# Patient Record
Sex: Female | Born: 2005 | Race: White | Hispanic: No | Marital: Single | Smoking: Never smoker
Health system: Southern US, Academic
[De-identification: ages and names within clinical notes are randomized; demographics above are authoritative.]

## PROBLEM LIST (undated history)

## (undated) DIAGNOSIS — M1990 Unspecified osteoarthritis, unspecified site: Secondary | ICD-10-CM

## (undated) DIAGNOSIS — Z993 Dependence on wheelchair: Secondary | ICD-10-CM

## (undated) DIAGNOSIS — Q068 Other specified congenital malformations of spinal cord: Secondary | ICD-10-CM

## (undated) DIAGNOSIS — Q760 Spina bifida occulta: Secondary | ICD-10-CM

## (undated) DIAGNOSIS — Q7649 Other congenital malformations of spine, not associated with scoliosis: Secondary | ICD-10-CM

## (undated) DIAGNOSIS — Z973 Presence of spectacles and contact lenses: Secondary | ICD-10-CM

## (undated) DIAGNOSIS — Z87440 Personal history of urinary (tract) infections: Secondary | ICD-10-CM

## (undated) DIAGNOSIS — N319 Neuromuscular dysfunction of bladder, unspecified: Secondary | ICD-10-CM

## (undated) DIAGNOSIS — K5900 Constipation, unspecified: Secondary | ICD-10-CM

## (undated) HISTORY — DX: Other specified congenital malformations of spinal cord: Q06.8

## (undated) HISTORY — PX: HX OTHER: 2100001105

## (undated) HISTORY — PX: TOE SURGERY: SHX1073

## (undated) HISTORY — PX: HX SPINAL CORD DECOMPRESSION: SHX97

## (undated) HISTORY — DX: Unspecified osteoarthritis, unspecified site: M19.90

## (undated) HISTORY — DX: Other congenital malformations of spine, not associated with scoliosis: Q76.49

---
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
## 2024-04-15 ENCOUNTER — Encounter (INDEPENDENT_AMBULATORY_CARE_PROVIDER_SITE_OTHER): Payer: Self-pay | Admitting: Urology

## 2024-04-15 NOTE — Nursing Note (Signed)
 Bartlett, Megan D, PA-C  Pammie Chirino, RN  We can schedule telephone visit with APP or AL-Omar          Previous Messages    Message from Fannie HERO sent at 04/15/2024  2:40 PM EDT    Summary: FW: Clinical Question    Copied From CRM [phone].  Warren (Parent) called with a clinical question.    [place] Pt    Pts mother is asking for a call back to discuss some questions that she has. Pts mother states that the pt is now on Medicare and they are requesting that office notes be sent for the past 12 months in order to help them cover the pts supplies.She states that the order for the pts supplies expires on 7.22.25.  They are needing to know if they are needing another appt as the pt has not been seen since 2024 so there are no records within the past 12 months.She would also like to know if this can be a telehealth visit as well. Please contact the pts mother about this    Thank you                Call History    Contact Date/Time Type Contact Phone/Fax By   04/15/2024 02:33 PM EDT Phone (Incoming) Warren (Parent) [phone] Chaney, [employer]     Attempted to return the call to the patient's mother.  There was no answer and a voice mail message was left requesting a return call.  The last clinic note from 04/27/23 was faxed to [facility] at [phone].    Zuri Lascala, RN

## 2024-04-19 ENCOUNTER — Encounter (INDEPENDENT_AMBULATORY_CARE_PROVIDER_SITE_OTHER): Payer: Self-pay | Admitting: Urology

## 2024-04-19 NOTE — Nursing Note (Signed)
 Message from [place] [facility] sent at 04/19/2024  4:11 PM EDT    Al-Omar    Rosaria from [employer] called because she received the office notes for this patient's incontinence supplies but it is dated outside of a year and the office notes need to be within a year's time. They will not send the supplies without it.  She is requesting a return call to advise when the patient's next appointment is and I told her that it seems to be made incorrectly as it is dated for a follow up of next year, even though the notes on the appointment says annual follow up.  The best callback number is listed and the queue will be sent to her since this is Dominique's case but said that any representative can assist.    Thank you!       Call History    Contact Date/Time Type Contact Phone/Fax By   04/19/2024 03:59 PM EDT Phone (Incoming) Rosaria [phone] [facility] Grate     Returned the call to [facility].  Spoke to [place].   She states that the patient needs annual visits for the insurance coverage of supplies. Last appointment was 04/27/23 and is not due for a follow up until 2026.  Metta will reach out to the patient's mother and have her call Urology back for an appointment or to discuss getting notes from the PCP.    Jany Buckwalter, RN

## 2024-04-20 ENCOUNTER — Encounter (INDEPENDENT_AMBULATORY_CARE_PROVIDER_SITE_OTHER): Payer: Self-pay | Admitting: Urology

## 2024-04-20 NOTE — Nursing Note (Signed)
 Attempted to contact the mother to follow up on catheter supplies.  There was no answer and a voice mail message was left requesting a return call.  Since the patient is not due to come back until July 2026, will offer a telephone visit with APP or may get notes from PCP.  Insurance needs annual chart notes for catheter coverage.    Rami Budhu, RN

## 2024-04-22 ENCOUNTER — Ambulatory Visit: Attending: Physician Assistant | Admitting: Physician Assistant

## 2024-04-22 DIAGNOSIS — N319 Neuromuscular dysfunction of bladder, unspecified: Secondary | ICD-10-CM

## 2024-04-22 MED ORDER — OXYBUTYNIN CHLORIDE ER 10 MG TABLET,EXTENDED RELEASE 24 HR
10.0000 mg | EXTENDED_RELEASE_TABLET | Freq: Every day | ORAL | 3 refills | Status: AC
Start: 2024-04-22 — End: 2025-04-22

## 2024-04-22 NOTE — Progress Notes (Signed)
 PEDIATRIC UROLOGY, PHYSICIAN OFFICE CENTER  [address]  [place] [address]  Operated by [facility]  Telephone Visit    Name:  Cynthia [name] MRN: [id number]   Date:  04/22/2024 DOB: November 04, 2005 (18 y.o.)          The patient/family initiated a request for telephone service.  Verbal consent for this service was obtained from the patient/family.  Reason for audio only:Patient preference    Last office visit in this department: 04/27/23      Reason for call: updated note for insurance reasons     Call notes: Cynthia [name] is 18 yo female with history of sacral agenesis and tethered cord. She is doing CIC every 3-4 hours with no daytime incontinence and rare nighttime incontinence. She continues to take ditropan  daily. No UTIs. No concerns.     Assessment:  Sacral agenesis  s/p tethered cord release  Neurogenic bladder, on CIC q3-4 hours and ditropan  10 mg XL  Neurogenic bowel, non-compliant with regimen    Plan:  Patient requires 6 straight catheters per day for urinary retention secondary to neurogenic bladder  Refilled ditropan  XL 10 mg daily  Follow up as scheduled with [facility] next year     No diagnosis found.    LOS Determination: Medical Decision Making- Direct audio communication with patient was 5 minutes    *This was an independent visit     Duwaine Sequin , PA-C

## 2024-04-25 ENCOUNTER — Encounter (INDEPENDENT_AMBULATORY_CARE_PROVIDER_SITE_OTHER): Payer: Self-pay | Admitting: Urology

## 2024-04-25 NOTE — Nursing Note (Signed)
 Clinic visit from 04/22/24 faxed to [facility] at [phone] for catheter supplies.    Ottis Vacha, RN

## 2024-05-02 ENCOUNTER — Ambulatory Visit (INDEPENDENT_AMBULATORY_CARE_PROVIDER_SITE_OTHER): Payer: Self-pay | Admitting: Urology

## 2024-05-24 ENCOUNTER — Ambulatory Visit (INDEPENDENT_AMBULATORY_CARE_PROVIDER_SITE_OTHER)

## 2024-07-15 ENCOUNTER — Ambulatory Visit: Payer: Self-pay | Attending: Nurse Practitioner | Admitting: Nurse Practitioner

## 2024-07-15 ENCOUNTER — Encounter (INDEPENDENT_AMBULATORY_CARE_PROVIDER_SITE_OTHER): Payer: Self-pay | Admitting: Nurse Practitioner

## 2024-07-15 ENCOUNTER — Other Ambulatory Visit: Payer: Self-pay

## 2024-07-15 VITALS — BP 112/52 | HR 85 | Temp 96.7°F | Ht <= 58 in | Wt 139.3 lb

## 2024-07-15 DIAGNOSIS — M545 Low back pain, unspecified: Secondary | ICD-10-CM | POA: Insufficient documentation

## 2024-07-15 DIAGNOSIS — Z981 Arthrodesis status: Secondary | ICD-10-CM | POA: Insufficient documentation

## 2024-07-15 NOTE — Progress Notes (Signed)
 NEUROSURGERY, PHYSICIAN OFFICE CENTER  [address]  [place] [address]  Operated by [facility]  New Patient Note    Name: Cynthia [name] MRN:  [id number]   Date: 07/15/2024 DOB:  [birth year] (18 y.o.)      Referring physician: Kennyth Grate, [facility]  [address]  [place],  [place] [address]    Gender: female  Last RTV on 03/06/2021 with Peds NSGY.    Chief Complaint:   Chief Complaint   Patient presents with    New Patient     History is provided by patient    History of Present Illness: some previously noted and relevant.   Cynthia [name] is a 18 y/o right handed female here today for evaluation lumbar spine. Initially reported: 18 yo F w a h/o sacral agenesis. She underwent tethered cord release at around the age of 15 months at [facility] in [place] . She is s/p tethered cord release by Dr. Andreas on 02/26/2021, in conjunction with an L2-pelvis spinal fusion by Dr. Glendon. She has a neurogenic bladder and follows with Urology and in the myelo clinic. She has followed with pain clinic in [facility] with recent injection that worsened her LBP. C/o pain in the low back only at this time.  She is requesting today to establish care with [facility] as well as recommendations for further treatment options nonsurgically.  Per last RTV on 03/06/2021 with Peds NSGY  Cynthia [name] is a female who recently s/p tethered cord release by Dr. Andreas on 02/26/2021 by Dr. Andreas, in conjunction with an L2-pelvis spinal fusion by Dr. Glendon. Cynthia [name] recently established care with neurosurgery on 12/13/20 for a history of sacral agenesis. She underwent tethered cord release at around the age of 228 months at [facility] in [place] .  She performs CIC around 8 times a day. She has declined a Mitrofanoff procedure in the past. She ambulates with AFOs but relies on wheelchair for longer distances outside of the the home. Her recent symptoms have included  progressive back and bilateral leg pain. Her imaging also showed a spondy at L5-S1, for which she was referred to orthopedics. She was felt to be an appropriate candidate for spinal fusion as well as release of tethered cord, and she underwent  tethered cord release by Dr. Andreas on 02/26/2021 by Dr. Andreas, in conjunction with an L2-pelvis spinal fusion by Dr. Glendon. She tolerated surgery well.     Today, the patient is doing fair. Pain is rated as a 0/10 however does report intermittent axial low back pain.  She at baseline straight caths urine. She also has to manually evacuate her bowels at times (Both chronic issues).     Past History  Current Medications[1]   Allergies[2]  Past Medical History:   Diagnosis Date    Arthrosis     Arthrosis of ankle 09/13/2013    Both ankles    Constipation     Lumbar pseudoarthrosis 04/01/2022    Neurogenic bladder     straight cath Q3-4 hr.    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    Tethered cord     Uses wheelchair     Vesicoureteral reflux 09/13/2013    Wears glasses      Past Surgical History:   Procedure Laterality Date    HX OTHER Bilateral  Club Foot surgery    HX OTHER  02/26/2021    L2-pelvis PSSI/PSF, allo & autograft & release of tethered cord    HX SPINAL CORD DECOMPRESSION  at age 228 months    Tethered cord release    TOE SURGERY Left      Family History  Family Medical History:       Problem Relation (Age of Onset)    Breast Cancer Maternal Grandmother    Diabetes Mother    Heart Attack Paternal Grandfather    Lung Cancer Paternal Grandmother    Melanoma Maternal Aunt, Maternal Grandmother, Paternal Grandmother    No Known Problems Father          Social History  Social History     Socioeconomic History    Marital status: Single   Occupational History    Occupation: Disabled   Tobacco Use    Smoking status: Never     Passive exposure: Never    Smokeless tobacco: Never    Vaping Use    Vaping status: Never Used   Substance and Sexual Activity    Alcohol use: Never    Drug use: Never   Other Topics Concern    Ability to Walk 1 Flight of Steps without SOB/CP No    Routine Exercise No    Ability to Walk 2 Flight of Steps without SOB/CP No    Ability To Do Own ADL's Yes    Other Activity Level Yes     Comment: uses wheelchair   Social History Narrative    Denies illicit drug use.      Review of Systems  Other than ROS in the HPI, all other systems were negative.    Examination  BP (!) 112/52   Pulse 85   Temp (!) 35.9 C (96.7 F) (Thermal Scan)   Ht 1.346 m (4' 5)   Wt 63.2 kg (139 lb 5.3 oz)   BMI 34.87 kg/m   97 %ile (Z= 1.85, 111% of 95%ile) based on CDC (Girls, 2-20 Years) BMI-for-age based on BMI available on 07/15/2024.    Constitutional:Well groomed, in no apparent distress  Head:  Normocephalic/ Atraumatic.   Eyes: Conjunctiva clear, difficult to visualize, no obvious abnormalities;   ENT:  Tongue midline, trachea midline   Cardiovascular:    Carotid arteries: Negative for carotid bruit bilaterally  Auscultation: Regular rate and rhythm  Peripheral vascular system: Pulses palpable, cap refill intact  Respiratory: CTA bilaterally, Unlabored  Gastrointestinal: BS x 4  Musculoskeletal  Gait and Station:  Unable to assess, sitting in wheelchair  Strength: 5/5 BUE; BLE can flex hips 4/5 and KE/KF 4/5, ankles fused   Muscle tone (upper extremities): WNL  Muscle tone (lower extremities): WNL  Sensory: intact to light touch  DTR's:    Brachio-  radialis Biceps Triceps Patellar Achilles  Hoffman's   Right 1+ 1+ 1+ 1+ 1+  Not present   Left 1+ 1+ 1+ 1+ 1+  Not present   Coordination: Normal rapid alternating movements   Musculoskeletal tenderness: negative  Neurological  Level of consciousness: Alert and oriented  Recent and remote memory: Good recall and able to follow commands  Attention span and concentration: Normal in conversation  Language/Speech: No aphasia or  dysarthria  Fund of knowledge: Appropriate in this setting  Cranial Nerves  2-12 appears intact  Data reviewed  Official reports:  Cynthia [name]     PROCEDURE DESCRIPTION: CT LUMBAR SPINE WO IV CONTRAST on 12/01/2022  CLINICAL INDICATION: Q06.9: Spinal dysgenesis (CMS HCC)  M54.9: Back pain, unspecified back location, unspecified back pain laterality, unspecified chronicity     COMPARISON: 12/13/2021     FINDINGS: Postoperative changes are seen from spinal fusion hardware with interpedicular screws at L2, L3 and at the upper sacral levels. There does appear to be compressed discogenic appearance of the L4 and L5 levels with fusion, compression and hemivertebra appearance; patient has a provided history of sacral dysgenesis.     Limited views of the lung bases are grossly unremarkable.     No acute fracture deformity is seen.     IMPRESSION:  Postoperative changes are again seen from posterior spinal fusion. There is sacral dysgenesis and of the lower lumbar spine as well. According to nonrib-bearing levels, the fusion changes are at L2 to the upper sacral level.    Cynthia [name]  Female, 18 years old.     XR SPINE SCOLIOSIS PA AND LAT performed on 11/24/2022 1:51 PM.     TECHNIQUE: 2 image(s) 2 view(s) submitted for interpretation.     REASON FOR EXAM: M41.9: Scoliosis, unspecified scoliosis type, unspecified spinal region     COMPARISON: 02/28/2022     PROCEDURE: Single frontal and lateral views of the entire spine were obtained with the patient standing.     Lungs: Adequately inflated without focal consolidation.     Heart: Within normal limits with respect to size.     Bowel: Non-obstructed in appearance. Mild stool retention, partially obscuring the sacrococcygeal elements of the spine.     There is posterior spinal fixation hardware of the lumbosacral spine. There is a possible new lucency through the right lower screw through the SI joint.     IMPRESSION:  There is a possible new  lucency through the right lower screw through the SI joint.     Discussions with other providers:   Prior notes and charts reviewed.     Assessment:      ICD-10-CM    1. Low back pain  M54.50 Referral to Pain Clnic - [facility]      2. S/P lumbar spinal fusion  Z98.1 Referral to Pain Clnic - [facility]    noted right lower screw through the SI joint lucency.        Treatment Plan  If and when she is interested in pursuing surgical intervention, she was instructed to either follow with Dr. Seretha, Peds NSGY or Dr. Issac NSGY.  It was advised that she perform advanced imaging (MRI lumbar spine) prior to follow up appt.  In the meantime, she would like to follow up with another pain management within   [facility] so referral was placed to [facility] per request for low back pain management/treatment deemed appropriate.    - RTC with NSGY PRN at this time.   - Continue Medical Management (Diet, Exercise, Medication).    - The patient has been advised to follow up with their PCP in regards any chronic medical conditions and any non-neurosurgical symptoms that they may have. A copy of the note from today's clinic appointment will be sent to the patient's PCP Deneice Abu, FNP on file (confirmed during visit)      The patient was seen independently.    Toribio Lent, APRN, CNP 07/15/2024, 11:49         [1]   Current Outpatient Medications:     cholecalciferol, vitamin D3, 25 mcg (1,000 unit) Oral Tablet, Take 1 Tablet (1,000 Units total)  by mouth Every 7 days, Disp: , Rfl:     cyclobenzaprine  (FLEXERIL ) 10 mg Oral Tablet, Take 1 Tablet (10 mg total) by mouth Three times a day, Disp: , Rfl:     fluticasone propionate (FLONASE) 50 mcg/actuation Nasal Spray, Suspension, Administer 1 Spray into each nostril Once per day as needed, Disp: , Rfl:     oxyBUTYnin  chloride (DITROPAN  XL) 10 mg Oral Tablet Extended Rel 24 hr, Take 1 Tablet (10 mg total) by mouth Daily, Disp: 90 Tablet, Rfl: 3  [2]   Allergies  Allergen  Reactions    Venom-Wasp  Other Adverse Reaction (Add comment)     Severe swelling at the site where stung

## 2024-08-05 ENCOUNTER — Ambulatory Visit (HOSPITAL_BASED_OUTPATIENT_CLINIC_OR_DEPARTMENT_OTHER): Payer: Self-pay | Admitting: Student in an Organized Health Care Education/Training Program

## 2024-10-31 ENCOUNTER — Ambulatory Visit (INDEPENDENT_AMBULATORY_CARE_PROVIDER_SITE_OTHER): Payer: Self-pay | Admitting: Urology

## 2025-05-01 ENCOUNTER — Ambulatory Visit (INDEPENDENT_AMBULATORY_CARE_PROVIDER_SITE_OTHER): Payer: Self-pay | Admitting: Urology

## 2025-05-01 ENCOUNTER — Ambulatory Visit (INDEPENDENT_AMBULATORY_CARE_PROVIDER_SITE_OTHER): Payer: Self-pay
